# Patient Record
Sex: Male | Born: 2000 | Race: Black or African American | Hispanic: No | Marital: Single | Smoking: Never smoker
Health system: Southern US, Community
[De-identification: ages and names within clinical notes are randomized; demographics above are authoritative.]

---
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
## 2021-11-06 IMAGING — CR DG CHEST 2V
2 series · 2 of 2 positions shown · non-contrast
Comparison: Chest radiograph 02/03/2020

CLINICAL DATA: Chest pain

EXAM:
CHEST - 2 VIEW

[w chest pa]
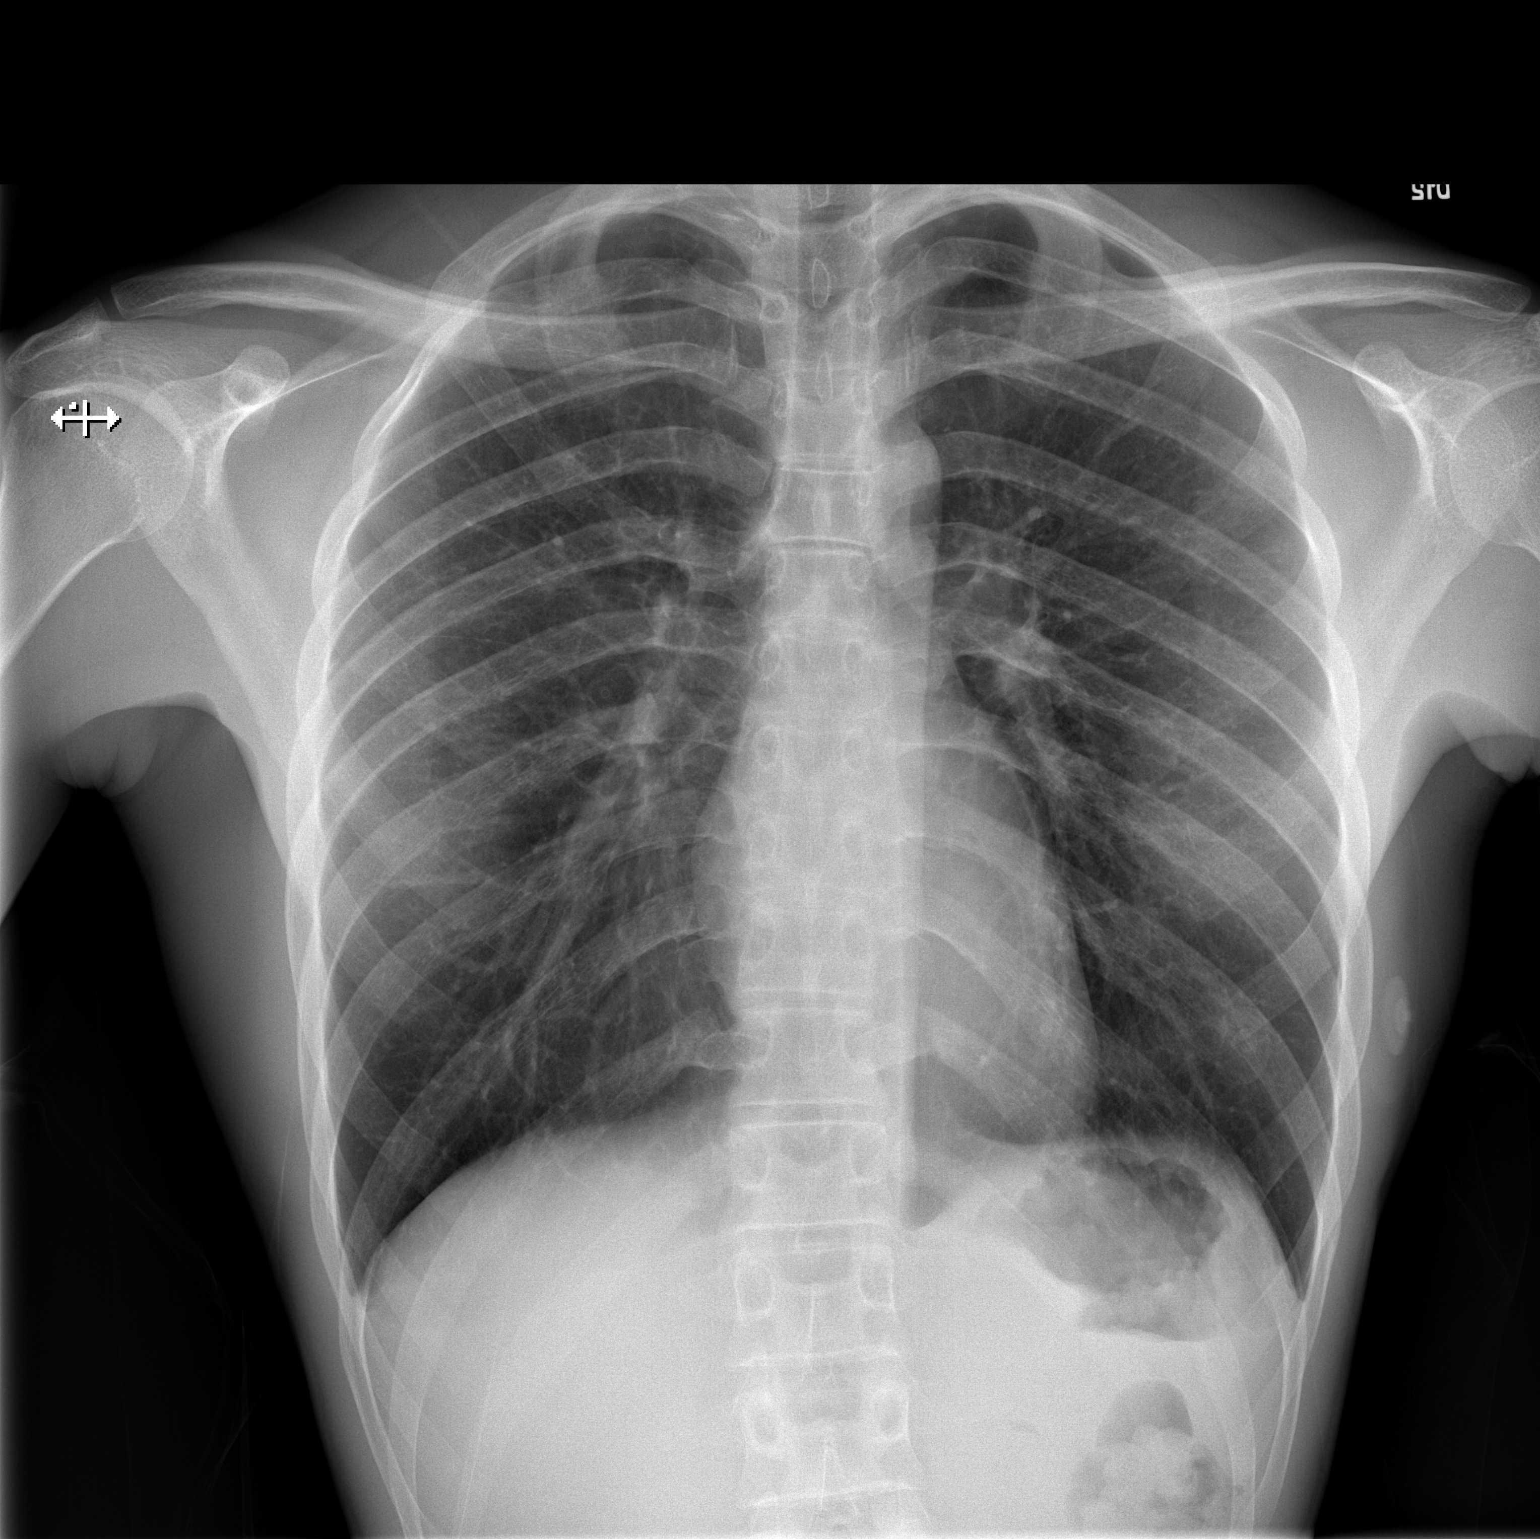

[w chest lat]
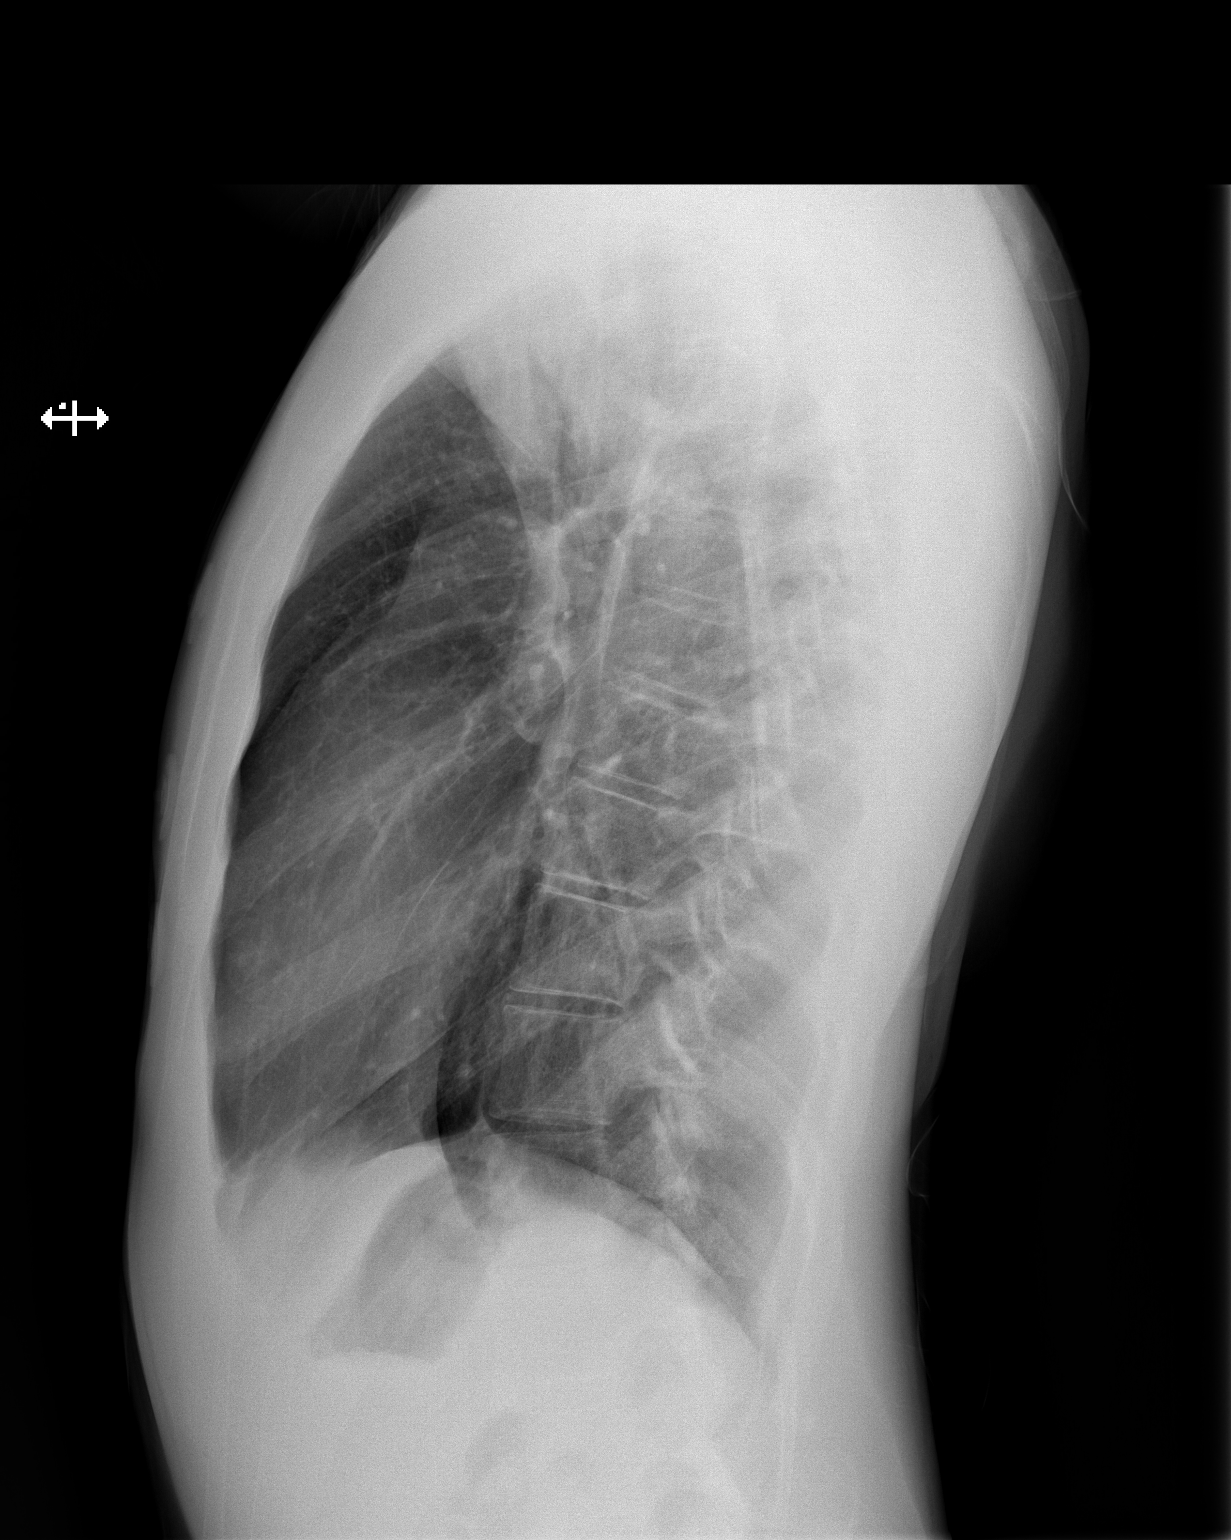

[2 of 2 positions shown; findings below may reference images not displayed]

FINDINGS: The heart size and mediastinal contours are within normal limits.
Both lungs are clear. The visualized skeletal structures are
unremarkable.
IMPRESSION: No acute cardiopulmonary disease.
